# Patient Record
Sex: Male | Born: 1950 | Race: White | Hispanic: No | Marital: Single
Health system: Southern US, Community
[De-identification: ages and names within clinical notes are randomized; demographics above are authoritative.]

---

## 2014-02-01 ENCOUNTER — Other Ambulatory Visit (HOSPITAL_COMMUNITY): Payer: Self-pay

## 2014-02-01 ENCOUNTER — Inpatient Hospital Stay
Admission: AD | Admit: 2014-02-01 | Discharge: 2014-03-05 | Disposition: A | Discharge status: Skilled Nursing Facility | Payer: Self-pay | Source: Ambulatory Visit | Attending: Internal Medicine | Admitting: Internal Medicine

## 2014-02-01 DIAGNOSIS — Z431 Encounter for attention to gastrostomy: Secondary | ICD-10-CM

## 2014-02-01 DIAGNOSIS — E119 Type 2 diabetes mellitus without complications: Secondary | ICD-10-CM

## 2014-02-01 DIAGNOSIS — Z930 Tracheostomy status: Secondary | ICD-10-CM

## 2014-02-01 DIAGNOSIS — K7030 Alcoholic cirrhosis of liver without ascites: Secondary | ICD-10-CM

## 2014-02-01 DIAGNOSIS — A419 Sepsis, unspecified organism: Secondary | ICD-10-CM

## 2014-02-01 DIAGNOSIS — J95821 Acute postprocedural respiratory failure: Secondary | ICD-10-CM

## 2014-02-01 DIAGNOSIS — W3400XA Accidental discharge from unspecified firearms or gun, initial encounter: Secondary | ICD-10-CM

## 2014-02-01 DIAGNOSIS — I615 Nontraumatic intracerebral hemorrhage, intraventricular: Secondary | ICD-10-CM

## 2014-02-01 DIAGNOSIS — K7460 Unspecified cirrhosis of liver: Secondary | ICD-10-CM

## 2014-02-01 DIAGNOSIS — I639 Cerebral infarction, unspecified: Secondary | ICD-10-CM

## 2014-02-01 LAB — BLOOD GAS, ARTERIAL
Acid-base deficit: 1.6 mmol/L (ref 0.0–2.0)
Bicarbonate: 20.6 mEq/L (ref 20.0–24.0)
FIO2: 0.3 %
MECHVT: 550 mL
O2 Saturation: 99.3 %
PEEP/CPAP: 5 cmH2O
PH ART: 7.545 — AB (ref 7.350–7.450)
PO2 ART: 139 mmHg — AB (ref 80.0–100.0)
Patient temperature: 98.6
RATE: 10 resp/min
TCO2: 21.4 mmol/L (ref 0–100)
pCO2 arterial: 23.9 mmHg — ABNORMAL LOW (ref 35.0–45.0)

## 2014-02-02 ENCOUNTER — Other Ambulatory Visit (HOSPITAL_COMMUNITY): Payer: Self-pay

## 2014-02-02 DIAGNOSIS — J95821 Acute postprocedural respiratory failure: Secondary | ICD-10-CM

## 2014-02-02 DIAGNOSIS — I615 Nontraumatic intracerebral hemorrhage, intraventricular: Secondary | ICD-10-CM

## 2014-02-02 DIAGNOSIS — Z431 Encounter for attention to gastrostomy: Secondary | ICD-10-CM

## 2014-02-02 DIAGNOSIS — E119 Type 2 diabetes mellitus without complications: Secondary | ICD-10-CM

## 2014-02-02 DIAGNOSIS — I639 Cerebral infarction, unspecified: Secondary | ICD-10-CM

## 2014-02-02 DIAGNOSIS — K7460 Unspecified cirrhosis of liver: Secondary | ICD-10-CM

## 2014-02-02 DIAGNOSIS — W3400XA Accidental discharge from unspecified firearms or gun, initial encounter: Secondary | ICD-10-CM

## 2014-02-02 DIAGNOSIS — Z930 Tracheostomy status: Secondary | ICD-10-CM

## 2014-02-02 DIAGNOSIS — A419 Sepsis, unspecified organism: Secondary | ICD-10-CM

## 2014-02-02 LAB — CBC WITH DIFFERENTIAL/PLATELET
Basophils Absolute: 0 10*3/uL (ref 0.0–0.1)
Basophils Relative: 0 % (ref 0–1)
Eosinophils Absolute: 0.3 10*3/uL (ref 0.0–0.7)
Eosinophils Relative: 3 % (ref 0–5)
HEMATOCRIT: 31.1 % — AB (ref 39.0–52.0)
HEMOGLOBIN: 10.2 g/dL — AB (ref 13.0–17.0)
LYMPHS ABS: 1.2 10*3/uL (ref 0.7–4.0)
LYMPHS PCT: 10 % — AB (ref 12–46)
MCH: 27.3 pg (ref 26.0–34.0)
MCHC: 32.8 g/dL (ref 30.0–36.0)
MCV: 83.4 fL (ref 78.0–100.0)
MONO ABS: 1.2 10*3/uL — AB (ref 0.1–1.0)
MONOS PCT: 10 % (ref 3–12)
NEUTROS ABS: 9.1 10*3/uL — AB (ref 1.7–7.7)
NEUTROS PCT: 77 % (ref 43–77)
Platelets: 155 10*3/uL (ref 150–400)
RBC: 3.73 MIL/uL — AB (ref 4.22–5.81)
RDW: 14.4 % (ref 11.5–15.5)
WBC: 11.8 10*3/uL — AB (ref 4.0–10.5)

## 2014-02-02 LAB — URINALYSIS, ROUTINE W REFLEX MICROSCOPIC
BILIRUBIN URINE: NEGATIVE
Bilirubin Urine: NEGATIVE
Glucose, UA: NEGATIVE mg/dL
Glucose, UA: NEGATIVE mg/dL
Hgb urine dipstick: NEGATIVE
Ketones, ur: NEGATIVE mg/dL
Ketones, ur: NEGATIVE mg/dL
Leukocytes, UA: NEGATIVE
Leukocytes, UA: NEGATIVE
Nitrite: NEGATIVE
Nitrite: NEGATIVE
PH: 5.5 (ref 5.0–8.0)
PROTEIN: 100 mg/dL — AB
PROTEIN: 100 mg/dL — AB
Specific Gravity, Urine: 1.015 (ref 1.005–1.030)
Specific Gravity, Urine: 1.013 (ref 1.005–1.030)
Urobilinogen, UA: 1 mg/dL (ref 0.0–1.0)
Urobilinogen, UA: 4 mg/dL — ABNORMAL HIGH (ref 0.0–1.0)
pH: 7 (ref 5.0–8.0)

## 2014-02-02 LAB — PREALBUMIN: Prealbumin: 8.9 mg/dL — ABNORMAL LOW (ref 17.0–34.0)

## 2014-02-02 LAB — COMPREHENSIVE METABOLIC PANEL
ALT: 93 U/L — ABNORMAL HIGH (ref 0–53)
ANION GAP: 17 — AB (ref 5–15)
AST: 95 U/L — ABNORMAL HIGH (ref 0–37)
Albumin: 2.4 g/dL — ABNORMAL LOW (ref 3.5–5.2)
Alkaline Phosphatase: 91 U/L (ref 39–117)
BILIRUBIN TOTAL: 0.5 mg/dL (ref 0.3–1.2)
BUN: 12 mg/dL (ref 6–23)
CHLORIDE: 99 meq/L (ref 96–112)
CO2: 18 meq/L — AB (ref 19–32)
CREATININE: 0.63 mg/dL (ref 0.50–1.35)
Calcium: 8.7 mg/dL (ref 8.4–10.5)
GFR calc Af Amer: >90 mL/min (ref >90)
GLUCOSE: 138 mg/dL — AB (ref 70–99)
Potassium: 3.8 mEq/L (ref 3.7–5.3)
Sodium: 134 mEq/L — ABNORMAL LOW (ref 137–147)
Total Protein: 6.9 g/dL (ref 6.0–8.3)

## 2014-02-02 LAB — BLOOD GAS, ARTERIAL
Acid-base deficit: 0.4 mmol/L (ref 0.0–2.0)
Bicarbonate: 22.5 mEq/L (ref 20.0–24.0)
FIO2: 0.3 %
O2 Saturation: 99.5 %
PATIENT TEMPERATURE: 99.4
PEEP/CPAP: 5 cmH2O
PH ART: 7.492 — AB (ref 7.350–7.450)
RATE: 10 resp/min
TCO2: 23.4 mmol/L (ref 0–100)
VT: 450 mL
pCO2 arterial: 29.8 mmHg — ABNORMAL LOW (ref 35.0–45.0)
pO2, Arterial: 213 mmHg — ABNORMAL HIGH (ref 80.0–100.0)

## 2014-02-02 LAB — APTT: aPTT: 27 seconds (ref 24–37)

## 2014-02-02 LAB — PROCALCITONIN: Procalcitonin: 0.17 ng/mL

## 2014-02-02 LAB — PHOSPHORUS: Phosphorus: 4.4 mg/dL (ref 2.3–4.6)

## 2014-02-02 LAB — PROTIME-INR
INR: 1.12 (ref 0.00–1.49)
PROTHROMBIN TIME: 14.4 s (ref 11.6–15.2)

## 2014-02-02 LAB — EXPECTORATED SPUTUM ASSESSMENT W GRAM STAIN, RFLX TO RESP C

## 2014-02-02 LAB — URINE MICROSCOPIC-ADD ON

## 2014-02-02 LAB — T4, FREE: FREE T4: 1.34 ng/dL (ref 0.80–1.80)

## 2014-02-02 LAB — VITAMIN B12: Vitamin B-12: 545 pg/mL (ref 211–911)

## 2014-02-02 LAB — MAGNESIUM: Magnesium: 1.9 mg/dL (ref 1.5–2.5)

## 2014-02-02 LAB — FOLATE RBC: RBC Folate: 1075 ng/mL — ABNORMAL HIGH (ref >280)

## 2014-02-02 LAB — TSH: TSH: 2.89 u[IU]/mL (ref 0.350–4.500)
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
## 2014-02-03 DIAGNOSIS — K7030 Alcoholic cirrhosis of liver without ascites: Secondary | ICD-10-CM

## 2014-02-03 DIAGNOSIS — I6350 Cerebral infarction due to unspecified occlusion or stenosis of unspecified cerebral artery: Secondary | ICD-10-CM

## 2014-02-03 LAB — BLOOD GAS, ARTERIAL
Acid-Base Excess: 0.6 mmol/L (ref 0.0–2.0)
Bicarbonate: 23.8 mEq/L (ref 20.0–24.0)
FIO2: 0.3 %
LHR: 10 {breaths}/min
O2 Saturation: 100 %
PATIENT TEMPERATURE: 98.6
PCO2 ART: 32.9 mmHg — AB (ref 35.0–45.0)
PEEP/CPAP: 5 cmH2O
PH ART: 7.473 — AB (ref 7.350–7.450)
TCO2: 24.8 mmol/L (ref 0–100)
VT: 450 mL
pO2, Arterial: 356 mmHg — ABNORMAL HIGH (ref 80.0–100.0)

## 2014-02-03 LAB — PROCALCITONIN: Procalcitonin: <0.1 ng/mL

## 2014-02-03 LAB — URINE CULTURE
CULTURE: NO GROWTH
Colony Count: NO GROWTH

## 2014-02-03 LAB — AMMONIA: AMMONIA: 28 umol/L (ref 11–60)

## 2014-02-03 LAB — VANCOMYCIN, TROUGH: VANCOMYCIN TR: 16.6 ug/mL (ref 10.0–20.0)
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
## 2014-02-04 LAB — CULTURE, RESPIRATORY W GRAM STAIN

## 2014-02-04 LAB — PROCALCITONIN

## 2014-02-04 LAB — CULTURE, RESPIRATORY
Culture: NORMAL
Gram Stain: NONE SEEN

## 2014-02-07 LAB — CBC
HCT: 31.7 % — ABNORMAL LOW (ref 39.0–52.0)
Hemoglobin: 10.2 g/dL — ABNORMAL LOW (ref 13.0–17.0)
MCH: 27.2 pg (ref 26.0–34.0)
MCHC: 32.2 g/dL (ref 30.0–36.0)
MCV: 84.5 fL (ref 78.0–100.0)
PLATELETS: 160 10*3/uL (ref 150–400)
RBC: 3.75 MIL/uL — AB (ref 4.22–5.81)
RDW: 14.1 % (ref 11.5–15.5)
WBC: 7.6 10*3/uL (ref 4.0–10.5)

## 2014-02-07 LAB — PREALBUMIN: PREALBUMIN: 15.4 mg/dL — AB (ref 17.0–34.0)
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
## 2014-02-08 LAB — CULTURE, BLOOD (ROUTINE X 2)
CULTURE: NO GROWTH
Culture: NO GROWTH

## 2014-02-08 LAB — BASIC METABOLIC PANEL
ANION GAP: 11 (ref 5–15)
BUN: 25 mg/dL — ABNORMAL HIGH (ref 6–23)
CO2: 32 meq/L (ref 19–32)
Calcium: 9.9 mg/dL (ref 8.4–10.5)
Chloride: 96 mEq/L (ref 96–112)
Creatinine, Ser: 0.54 mg/dL (ref 0.50–1.35)
GFR calc Af Amer: >90 mL/min (ref >90)
GLUCOSE: 169 mg/dL — AB (ref 70–99)
POTASSIUM: 4 meq/L (ref 3.7–5.3)
Sodium: 139 mEq/L (ref 137–147)
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
## 2014-02-11 LAB — CBC
HCT: 33 % — ABNORMAL LOW (ref 39.0–52.0)
Hemoglobin: 10.4 g/dL — ABNORMAL LOW (ref 13.0–17.0)
MCH: 27.5 pg (ref 26.0–34.0)
MCHC: 31.5 g/dL (ref 30.0–36.0)
MCV: 87.3 fL (ref 78.0–100.0)
Platelets: 126 10*3/uL — ABNORMAL LOW (ref 150–400)
RBC: 3.78 MIL/uL — AB (ref 4.22–5.81)
RDW: 14.4 % (ref 11.5–15.5)
WBC: 7.4 10*3/uL (ref 4.0–10.5)

## 2014-02-11 LAB — BASIC METABOLIC PANEL
Anion gap: 6 (ref 5–15)
BUN: 35 mg/dL — ABNORMAL HIGH (ref 6–23)
CHLORIDE: 99 meq/L (ref 96–112)
CO2: 34 meq/L — AB (ref 19–32)
CREATININE: 0.64 mg/dL (ref 0.50–1.35)
Calcium: 10 mg/dL (ref 8.4–10.5)
GFR calc non Af Amer: >90 mL/min (ref >90)
Glucose, Bld: 217 mg/dL — ABNORMAL HIGH (ref 70–99)
Potassium: 4.1 mEq/L (ref 3.7–5.3)
Sodium: 139 mEq/L (ref 137–147)

## 2014-02-11 LAB — BLOOD GAS, ARTERIAL
Acid-Base Excess: 8.3 mmol/L — ABNORMAL HIGH (ref 0.0–2.0)
Bicarbonate: 32.5 mEq/L — ABNORMAL HIGH (ref 20.0–24.0)
FIO2: 28 %
O2 Saturation: 98.7 %
PATIENT TEMPERATURE: 98.6
PCO2 ART: 47.5 mmHg — AB (ref 35.0–45.0)
PH ART: 7.451 — AB (ref 7.350–7.450)
TCO2: 34 mmol/L (ref 0–100)
pO2, Arterial: 121 mmHg — ABNORMAL HIGH (ref 80.0–100.0)
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
## 2014-02-17 LAB — BASIC METABOLIC PANEL
Anion gap: 10 (ref 5–15)
BUN: 44 mg/dL — AB (ref 6–23)
CALCIUM: 10.5 mg/dL (ref 8.4–10.5)
CO2: 33 mEq/L — ABNORMAL HIGH (ref 19–32)
Chloride: 100 mEq/L (ref 96–112)
Creatinine, Ser: 0.73 mg/dL (ref 0.50–1.35)
GFR calc Af Amer: >90 mL/min (ref >90)
GFR calc non Af Amer: >90 mL/min (ref >90)
GLUCOSE: 226 mg/dL — AB (ref 70–99)
Potassium: 4.4 mEq/L (ref 3.7–5.3)
SODIUM: 143 meq/L (ref 137–147)

## 2014-02-17 LAB — PREALBUMIN: Prealbumin: 23.1 mg/dL (ref 17.0–34.0)
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
## 2014-02-19 LAB — BASIC METABOLIC PANEL
Anion gap: 10 (ref 5–15)
BUN: 46 mg/dL — ABNORMAL HIGH (ref 6–23)
CALCIUM: 10.3 mg/dL (ref 8.4–10.5)
CO2: 30 meq/L (ref 19–32)
Chloride: 99 mEq/L (ref 96–112)
Creatinine, Ser: 0.77 mg/dL (ref 0.50–1.35)
GFR calc Af Amer: >90 mL/min (ref >90)
GLUCOSE: 188 mg/dL — AB (ref 70–99)
Potassium: 4.3 mEq/L (ref 3.7–5.3)
SODIUM: 139 meq/L (ref 137–147)
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
## 2014-02-21 ENCOUNTER — Other Ambulatory Visit (HOSPITAL_COMMUNITY): Payer: Self-pay

## 2014-02-21 LAB — CBC
HEMATOCRIT: 34 % — AB (ref 39.0–52.0)
Hemoglobin: 11 g/dL — ABNORMAL LOW (ref 13.0–17.0)
MCH: 27.5 pg (ref 26.0–34.0)
MCHC: 32.4 g/dL (ref 30.0–36.0)
MCV: 85 fL (ref 78.0–100.0)
Platelets: 91 10*3/uL — ABNORMAL LOW (ref 150–400)
RBC: 4 MIL/uL — ABNORMAL LOW (ref 4.22–5.81)
RDW: 15.1 % (ref 11.5–15.5)
WBC: 9.3 10*3/uL (ref 4.0–10.5)

## 2014-02-21 LAB — BASIC METABOLIC PANEL
Anion gap: 10 (ref 5–15)
BUN: 43 mg/dL — AB (ref 6–23)
CALCIUM: 10.2 mg/dL (ref 8.4–10.5)
CHLORIDE: 99 meq/L (ref 96–112)
CO2: 30 mEq/L (ref 19–32)
CREATININE: 0.64 mg/dL (ref 0.50–1.35)
GFR calc non Af Amer: >90 mL/min (ref >90)
Glucose, Bld: 106 mg/dL — ABNORMAL HIGH (ref 70–99)
Potassium: 4.4 mEq/L (ref 3.7–5.3)
Sodium: 139 mEq/L (ref 137–147)
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
## 2014-02-28 LAB — CULTURE, RESPIRATORY

## 2014-02-28 LAB — CULTURE, RESPIRATORY W GRAM STAIN
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
## 2014-03-02 NOTE — Progress Notes (Signed)
[facility]                                                                                              Progress note     Patient Demographics  Dominic Cross, is a 63 y.o. male  [id number]  [id number]  DOB - January 18, 1951  Admit date - 02/01/2014  Admitting Physician Carron Curie, MD  Outpatient Primary MD for the patient is No primary provider on file.  LOS - 29   Chief complaint   Respiratory failure   Intracerebral bleed     Subjective:   Dominic Cross cannot give any history or ROS 2/2 encephalopathy.   Objective:   Vital signs  Tmax:   98.6 Heart rate:  60-70 Respiratory rate: 18-20 Blood pressure: 120/70-150/80 Pulse Ox:  97-99%  I/O:   Reviewed, adequate UOP BM:   2  Exam  Gen:  Awake but somolent  HEENT: [place]/AT, PERRLA EOMI bilaterally, MMM NECK: Supple, No JVD or LAD. Tracheostomy in midline #6 LUNGS:Symmetrical Chest wall movement, decreased breath sounds bilaterally with fine rales CV; Normal S1, S2, with grade II/VI systolic murmur, no c/r. No parasternal heave ABD: S/NT/ND, BS X 4. PEG tube in place and secure EXT: Diffuse muscle wasting, No cyanosis, clubbing or edema, Pulses 2+    Data Review   CBC No results found for this basename: WBC, HGB, HCT, PLT, MCV, MCH, MCHC, RDW, NEUTRABS, LYMPHSABS, MONOABS, EOSABS, BASOSABS, BANDABS, BANDSABD,  in the last 168 hours  Chemistries  No results found for this basename: NA, K, CL, CO2, GLUCOSE, BUN, CREATININE, GFRCGP, CALCIUM, MG, AST, ALT, ALKPHOS, BILITOT,  in the last 168 hours ------------------------------------------------------------------------------------------------------------------ CrCl is unknown because there is no height on file for the current visit. ------------------------------------------------------------------------------------------------------------------ No results found for this basename: HGBA1C,   in the last 72 hours ------------------------------------------------------------------------------------------------------------------ No results found for this basename: CHOL, HDL, LDLCALC, TRIG, CHOLHDL, LDLDIRECT,  in the last 72 hours ------------------------------------------------------------------------------------------------------------------ No results found for this basename: TSH, T4TOTAL, FREET3, T3FREE, THYROIDAB,  in the last 72 hours ------------------------------------------------------------------------------------------------------------------ No results found for this basename: VITAMINB12, FOLATE, FERRITIN, TIBC, IRON, RETICCTPCT,  in the last 72 hours  Coagulation profile No results found for this basename: INR, PROTIME,  in the last 168 hours  No results found for this basename: DDIMER,  in the last 72 hours  Cardiac Enzymes No results found for this basename: CK, CKMB, TROPONINI, MYOGLOBIN,  in the last 168 hours ------------------------------------------------------------------------------------------------------------------ No components found with this basename: POCBNP,   Micro Results Recent Results (from the past 240 hour(s))  CULTURE, RESPIRATORY (NON-EXPECTORATED)     Status: None   Collection Time    02/25/14  5:19 PM      Result Value Ref Range Status   Specimen Description TRACHEAL ASPIRATE   Final   Special Requests NONE   Final   Gram Stain     Final   Value: FEW WBC PRESENT,BOTH PMN AND MONONUCLEAR     FEW SQUAMOUS EPITHELIAL CELLS PRESENT     FEW GRAM NEGATIVE RODS     Performed at [facility]  Final   Value: ABUNDANT PSEUDOMONAS AERUGINOSA     Performed at [facility]   Report Status 02/28/2014 FINAL   Final   Organism ID, Bacteria PSEUDOMONAS AERUGINOSA   Final       Assessment & Plan    VDRF/Pseudomonas aeruginosa tracheobronchitis: -continue with ATC 28% -Secretions improved since starting  doxycycline, complete 5 day course, complete 9/11; otherwise completed IV abx  Intracranial hemorrhage/Acute Encephalopathy/Alcoholism: -maintain a normotensive state, acute encephalopathy worsen with h/o long time alcoholism and ICH -likely near baseline with some irreversible damage from alcoholism and previous brain hemorrhage -can titrate seroquel  BID if needed for acute psychosis, continue to monitor  Diabetes mellitus type 2, uncontrolled: -continue to titrate insulin to goals and tolerance (levemir 16units BID)  Protein calorie malnutrition on tube feeds per PEG tube/Generalized Weakness: -continue nutritional support, vitamin supplementation; continue PT/OT efforts, limited 2/2 organic brain disease and encephalopathy  Liver cirrhosis -monitor LFTS, CBC, Coags profile and for active bleeding  Urinary retention: -resolved, foley has been d/c and adequate UOP   Hypertension, uncontrolled: -improved, continue to titrate anti-hypertensive agents to goal and tolerance.  -norvasc  and metoprolol  BID  Bradycardia: -resolved, continue to monitor VS trends  -monitor given scheduled dose of metoprolol Acute Constipation: -initiating Senna-S 2 tabs PO qhs especially given scheduled oxycodone  Code Status: Full  DVT Prophylaxis SCDs GI Proph: pepcid and probiotics  Berton Lan M.D on 03/02/2014 at 10:07 PM

## 2014-03-03 NOTE — Progress Notes (Addendum)
[facility]                                                                                              Progress note     Patient Demographics  Dominic Cross, is a 63 y.o. male  ZOX:[id number]  [id number]:[id number]  DOB - 11/04/1950  Admit date - 02/01/2014  Admitting Physician Carron Curie, MD  Outpatient Primary MD for the patient is No primary provider on file.  LOS - 30   Chief complaint   Respiratory failure   Intracerebral bleed       Subjective:   Dominic Cross cannot give any history 2/2 encephalopathy. Decreased secretions.  Objective:   Vital signs  Tmax: 98.4  Heart rate: 60-70  Respiratory rate: 18-20  Blood pressure: 120/70-144/760 Pulse Ox: 97-99%  I/O: Reviewed, adequate UOP  BM: 0  Exam  Gen: Awake but somolent  HEENT: /AT, PERRLA EOMI bilaterally, MMM  NECK: Supple, No JVD or LAD. Tracheostomy in midline #6  LUNGS:Symmetrical Chest wall movement, decreased breath sounds bilaterally with fine rales  CV; Normal S1, S2, with grade II/VI systolic murmur, no c/r. No parasternal heave  ABD: S/NT/ND, BS X 4. PEG tube in place and secure  EXT: Diffuse muscle wasting, No cyanosis, clubbing or edema, Pulses 2+  Data Review   CBC No results found for this basename: WBC, HGB, HCT, PLT, MCV, MCH, MCHC, RDW, NEUTRABS, LYMPHSABS, MONOABS, EOSABS, BASOSABS, BANDABS, BANDSABD,  in the last 168 hours  Chemistries  No results found for this basename: NA, K, CL, CO2, GLUCOSE, BUN, CREATININE, GFRCGP, CALCIUM, MG, AST, ALT, ALKPHOS, BILITOT,  in the last 168 hours ------------------------------------------------------------------------------------------------------------------ CrCl is unknown because there is no height on file for the current visit. ------------------------------------------------------------------------------------------------------------------ No results found for this  basename: HGBA1C,  in the last 72 hours ------------------------------------------------------------------------------------------------------------------ No results found for this basename: CHOL, HDL, LDLCALC, TRIG, CHOLHDL, LDLDIRECT,  in the last 72 hours ------------------------------------------------------------------------------------------------------------------ No results found for this basename: TSH, T4TOTAL, FREET3, T3FREE, THYROIDAB,  in the last 72 hours ------------------------------------------------------------------------------------------------------------------ No results found for this basename: VITAMINB12, FOLATE, FERRITIN, TIBC, IRON, RETICCTPCT,  in the last 72 hours  Coagulation profile No results found for this basename: INR, PROTIME,  in the last 168 hours  No results found for this basename: DDIMER,  in the last 72 hours  Cardiac Enzymes No results found for this basename: CK, CKMB, TROPONINI, MYOGLOBIN,  in the last 168 hours ------------------------------------------------------------------------------------------------------------------ No components found with this basename: POCBNP,   Micro Results Recent Results (from the past 240 hour(s))  CULTURE, RESPIRATORY (NON-EXPECTORATED)     Status: None   Collection Time    02/25/14  5:19 PM      Result Value Ref Range Status   Specimen Description TRACHEAL ASPIRATE   Final   Special Requests NONE   Final   Gram Stain     Final   Value: FEW WBC PRESENT,BOTH PMN AND MONONUCLEAR     FEW SQUAMOUS EPITHELIAL CELLS PRESENT     FEW GRAM NEGATIVE RODS     Performed at [facility]  Final   Value: ABUNDANT PSEUDOMONAS AERUGINOSA     Performed at [facility]   Report Status 02/28/2014 FINAL   Final   Organism ID, Bacteria PSEUDOMONAS AERUGINOSA   Final    Assessment & Plan   VDRF/Pseudomonas aeruginosa tracheobronchitis:  -continue with ATC 28%  -Secretions improved since  starting doxycycline, complete 5 day course, complete 9/11; otherwise completed IV abx   Intracranial hemorrhage/Acute Encephalopathy/Alcoholism:  -maintain a normotensive state, acute encephalopathy worsen with h/o long time alcoholism and ICH  -likely near baseline with some irreversible damage from alcoholism and previous brain hemorrhage  -can titrate seroquel  BID if needed for acute psychosis, continue to monitor   Diabetes mellitus type 2, uncontrolled:  -continue to titrate insulin to goals and tolerance (levemir 16units BID)   Protein calorie malnutrition on tube feeds per PEG tube/Generalized Weakness:  -continue nutritional support, vitamin supplementation; continue PT/OT efforts, limited 2/2 organic brain disease and encephalopathy   Liver cirrhosis  -monitor LFTS, CBC, Coags profile and for active bleeding; redraw 9/12  Hypertension, uncontrolled: normotensive today -improved, continue to titrate anti-hypertensive agents to goal and tolerance.  -norvasc  and metoprolol  BID   Bradycardia:  -resolved, continue to monitor VS trends  -monitor given scheduled dose of metoprolol   Acute Constipation:  -initiating Senna-S 2 tabs PO qhs especially given scheduled oxycodone   Code Status: Full  Family Communication: Family meeting, 35 minutes with 3 family members and case mgmt, all details of treatment plan, prognosis, clinical state and discharge plan discussed. All questions answer. Awaiting SNF placement  DVT Prophylaxis SCDs  GI Proph: pepcid and probiotics  Berton Lan M.D on 03/03/2014 at 11:34 PM

## 2014-03-04 NOTE — Progress Notes (Addendum)
[facility]                                                                                              Progress note     Patient Demographics  Dominic Cross, is a 9Davi Cross  ZOX:[id number]  WJX:[id number]  DOB - 1950/11/10  Admit date - 02/01/2014  Admitting Physician Carron Curie, MD  Outpatient Primary MD for the patient is No primary provider on file.  LOS - 31   Chief complaint   Respiratory failure   Intracerebral bleed      Subjective:   Dominic Cross cannot give any history 2/2 encephalopathy, however alert and activated.    Objective:   Vital signs  Tmax: 98.4  Heart rate: 58-89  Respiratory rate: 18-20  Blood pressure: 120/70-144/760 Pulse Ox: 97-99%  I/O: Reviewed, adequate UOP BG: ? BM: 4  Exam  Gen: Awake and Alert, best this week HEENT: [place]/AT, PERRLA EOMI bilaterally, MMM  NECK: Supple, No JVD or LAD. Tracheostomy in midline #6  LUNGS:Symmetrical Chest wall movement, decreased breath sounds bilaterally with fine rales  CV; Normal S1, S2, with grade II/VI systolic murmur, no c/r. No parasternal heave  ABD: S/NT/ND, BS X 4. PEG tube in place and secure  EXT: Diffuse muscle wasting, No cyanosis, clubbing or edema, Pulses 2+  Data Review   CBC No results found for this basename: WBC, HGB, HCT, PLT, MCV, MCH, MCHC, RDW, NEUTRABS, LYMPHSABS, MONOABS, EOSABS, BASOSABS, BANDABS, BANDSABD,  in the last 168 hours  Chemistries  No results found for this basename: NA, K, CL, CO2, GLUCOSE, BUN, CREATININE, GFRCGP, CALCIUM, MG, AST, ALT, ALKPHOS, BILITOT,  in the last 168 hours ------------------------------------------------------------------------------------------------------------------ CrCl is unknown because there is no height on file for the current visit. ------------------------------------------------------------------------------------------------------------------ No  results found for this basename: HGBA1C,  in the last 72 hours ------------------------------------------------------------------------------------------------------------------ No results found for this basename: CHOL, HDL, LDLCALC, TRIG, CHOLHDL, LDLDIRECT,  in the last 72 hours ------------------------------------------------------------------------------------------------------------------ No results found for this basename: TSH, T4TOTAL, FREET3, T3FREE, THYROIDAB,  in the last 72 hours ------------------------------------------------------------------------------------------------------------------ No results found for this basename: VITAMINB12, FOLATE, FERRITIN, TIBC, IRON, RETICCTPCT,  in the last 72 hours  Coagulation profile No results found for this basename: INR, PROTIME,  in the last 168 hours  No results found for this basename: DDIMER,  in the last 72 hours  Cardiac Enzymes No results found for this basename: CK, CKMB, TROPONINI, MYOGLOBIN,  in the last 168 hours ------------------------------------------------------------------------------------------------------------------ No components found with this basename: POCBNP,   Micro Results Recent Results (from the past 240 hour(s))  CULTURE, RESPIRATORY (NON-EXPECTORATED)     Status: None   Collection Time    02/25/14  5:19 PM      Result Value Ref Range Status   Specimen Description TRACHEAL ASPIRATE   Final   Special Requests NONE   Final   Gram Stain     Final   Value: FEW WBC PRESENT,BOTH PMN AND MONONUCLEAR     FEW SQUAMOUS EPITHELIAL CELLS PRESENT     FEW GRAM NEGATIVE RODS     Performed at [facility]   Culture     Final   Value: ABUNDANT PSEUDOMONAS AERUGINOSA     Performed at [facility]   Report Status 02/28/2014 FINAL   Final   Organism ID, Bacteria PSEUDOMONAS AERUGINOSA   Final    Assessment & Plan   VDRF/Pseudomonas aeruginosa tracheobronchitis:  -continue with ATC 28%   -Secretions improved since starting doxycycline, course completed today  Intracranial hemorrhage/Acute Encephalopathy/Alcoholism:  -maintain a normotensive state, acute encephalopathy worsen with h/o long time alcoholism and ICH  -likely near baseline with some irreversible damage from alcoholism and previous brain hemorrhage  -can titrate seroquel  BID if needed for acute psychosis, continue to monitor   Diabetes mellitus type 2, uncontrolled:  -continue to titrate insulin to goals and tolerance (levemir 16units BID)  -ordered accuchecks q 6h and results to be recorded on pts flowsheet so we can monitor trends  Protein calorie malnutrition on tube feeds per PEG tube/Generalized Weakness:  -continue nutritional support, vitamin supplementation; continue PT/OT efforts, limited 2/2 organic brain disease and encephalopathy  -add thiamine and folate daily -check mg, and phos in am  Liver cirrhosis  -monitor LFTS, CBC, Coags profile and for active bleeding; redraw 9/12 -monitor for statin tolerance given HLP and taking Lipitor  daily  HLP: -see above  Hypertension, uncontrolled: normotensive today -improved, continue to titrate anti-hypertensive agents to goal and tolerance.  -norvasc  and metoprolol  BID   Bradycardia:  -resolved, continue to monitor VS trends  -monitor given scheduled dose of metoprolol   Acute Constipation: improved -continue Senna-S 2 tabs PO qhs especially given scheduled oxycodone; hold if increase stooling  Code Status: Full   DVT Prophylaxis SCDs  GI Proph: pepcid and probiotics  Berton Lan M.D on 03/04/2014 at 1:05 PM

## 2014-03-05 LAB — CBC
HEMATOCRIT: 36 % — AB (ref 39.0–52.0)
HEMOGLOBIN: 11.6 g/dL — AB (ref 13.0–17.0)
MCH: 27.4 pg (ref 26.0–34.0)
MCHC: 32.2 g/dL (ref 30.0–36.0)
MCV: 84.9 fL (ref 78.0–100.0)
Platelets: 97 10*3/uL — ABNORMAL LOW (ref 150–400)
RBC: 4.24 MIL/uL (ref 4.22–5.81)
RDW: 15 % (ref 11.5–15.5)
WBC: 9.3 10*3/uL (ref 4.0–10.5)

## 2014-03-05 LAB — COMPREHENSIVE METABOLIC PANEL
ALT: 77 U/L — AB (ref 0–53)
AST: 58 U/L — AB (ref 0–37)
Albumin: 3.6 g/dL (ref 3.5–5.2)
Alkaline Phosphatase: 100 U/L (ref 39–117)
Anion gap: 10 (ref 5–15)
BUN: 41 mg/dL — ABNORMAL HIGH (ref 6–23)
CO2: 30 mEq/L (ref 19–32)
Calcium: 10.8 mg/dL — ABNORMAL HIGH (ref 8.4–10.5)
Chloride: 98 mEq/L (ref 96–112)
Creatinine, Ser: 0.62 mg/dL (ref 0.50–1.35)
GFR calc Af Amer: >90 mL/min (ref >90)
GFR calc non Af Amer: >90 mL/min (ref >90)
Glucose, Bld: 142 mg/dL — ABNORMAL HIGH (ref 70–99)
POTASSIUM: 4.6 meq/L (ref 3.7–5.3)
SODIUM: 138 meq/L (ref 137–147)
TOTAL PROTEIN: 9.1 g/dL — AB (ref 6.0–8.3)
Total Bilirubin: 0.3 mg/dL (ref 0.3–1.2)

## 2014-03-05 LAB — PHOSPHORUS: Phosphorus: 4.4 mg/dL (ref 2.3–4.6)

## 2014-03-05 LAB — PROTIME-INR
INR: 1.05 (ref 0.00–1.49)
PROTHROMBIN TIME: 13.7 s (ref 11.6–15.2)

## 2014-03-05 LAB — APTT: aPTT: 28 seconds (ref 24–37)

## 2014-03-05 LAB — MAGNESIUM: MAGNESIUM: 2 mg/dL (ref 1.5–2.5)
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
## 2015-09-12 IMAGING — CR DG ABD PORTABLE 1V
1 series · 1 of 1 positions shown · non-contrast
Comparison: None.

CLINICAL DATA: Check gastrostomy placement

EXAM:
PORTABLE ABDOMEN - 1 VIEW

[AP]
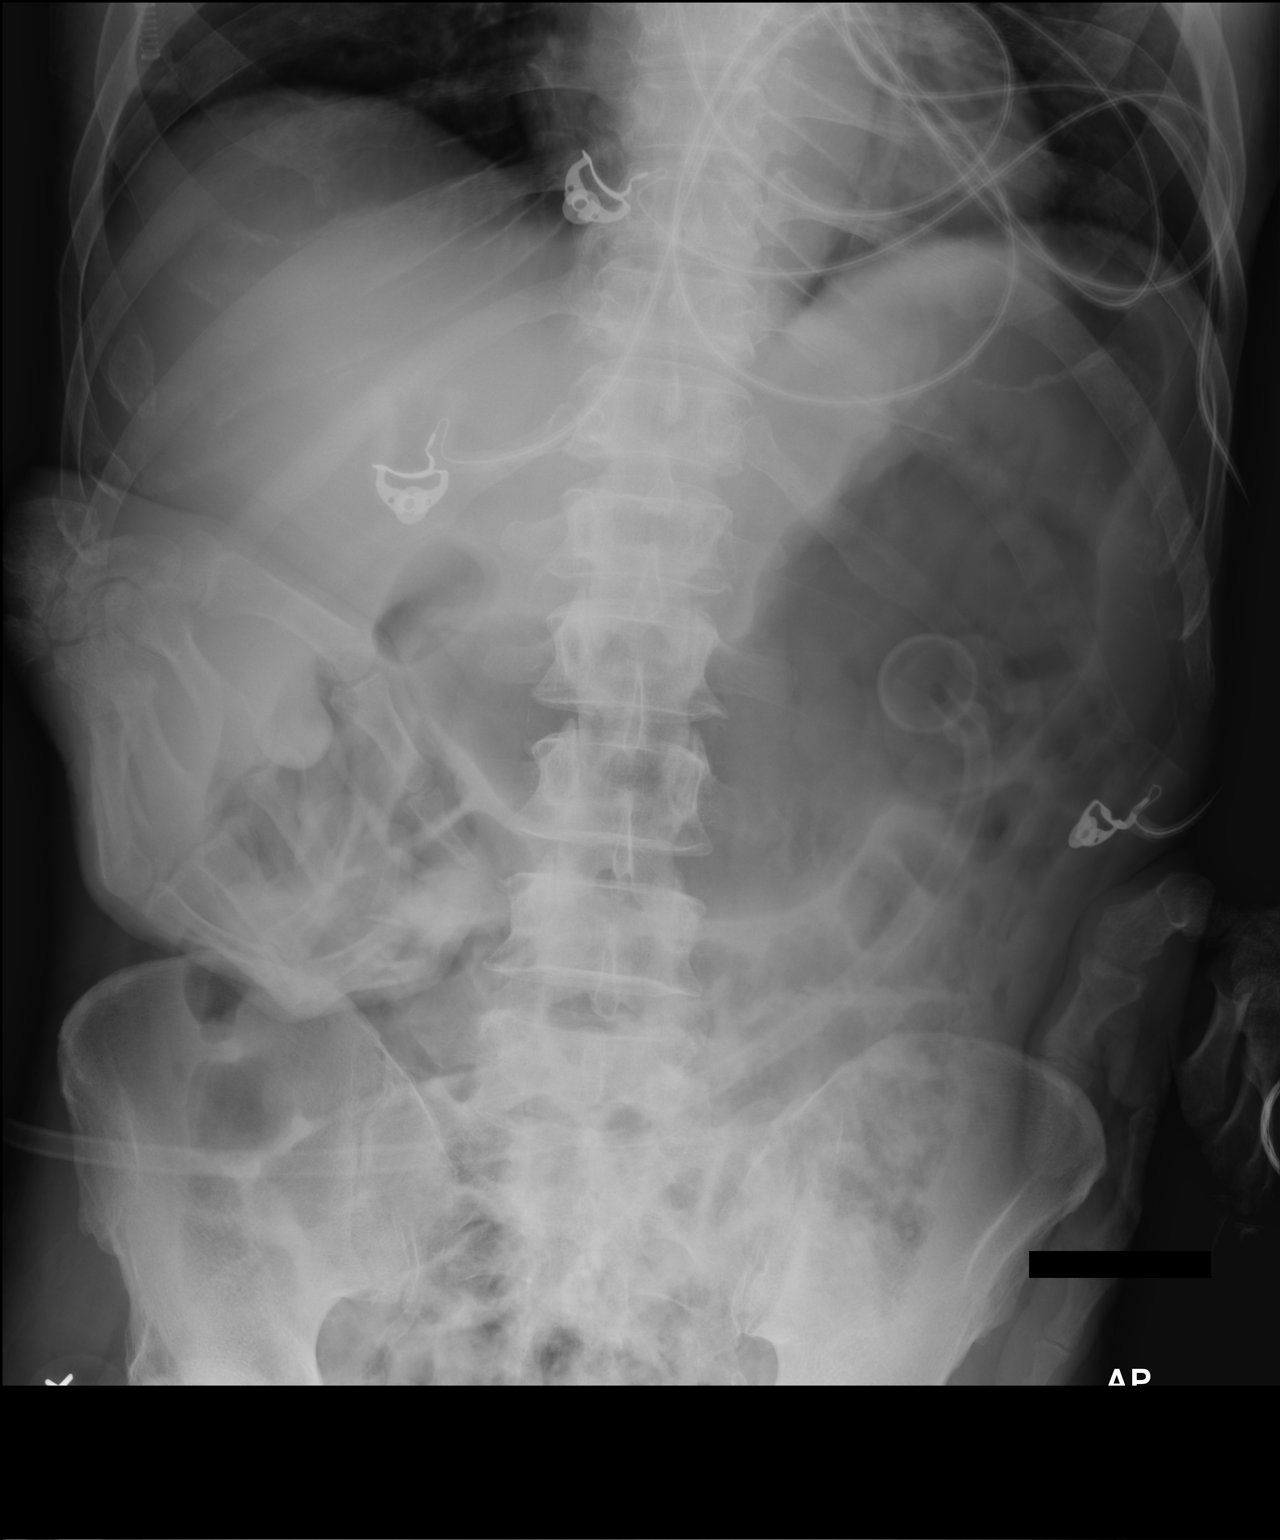

[1 of 1 positions shown; findings below may reference images not displayed]

FINDINGS: Scattered large and small bowel gas is noted. Gastrostomy catheter
appears to lie within the stomach. No acute bony abnormality is
seen.

## 2015-09-13 IMAGING — CR DG CHEST 1V PORT
1 series · 1 of 1 positions shown · non-contrast
Comparison: 1 day prior

CLINICAL DATA: PICC line placement.

EXAM:
PORTABLE CHEST - 1 VIEW

[AP]
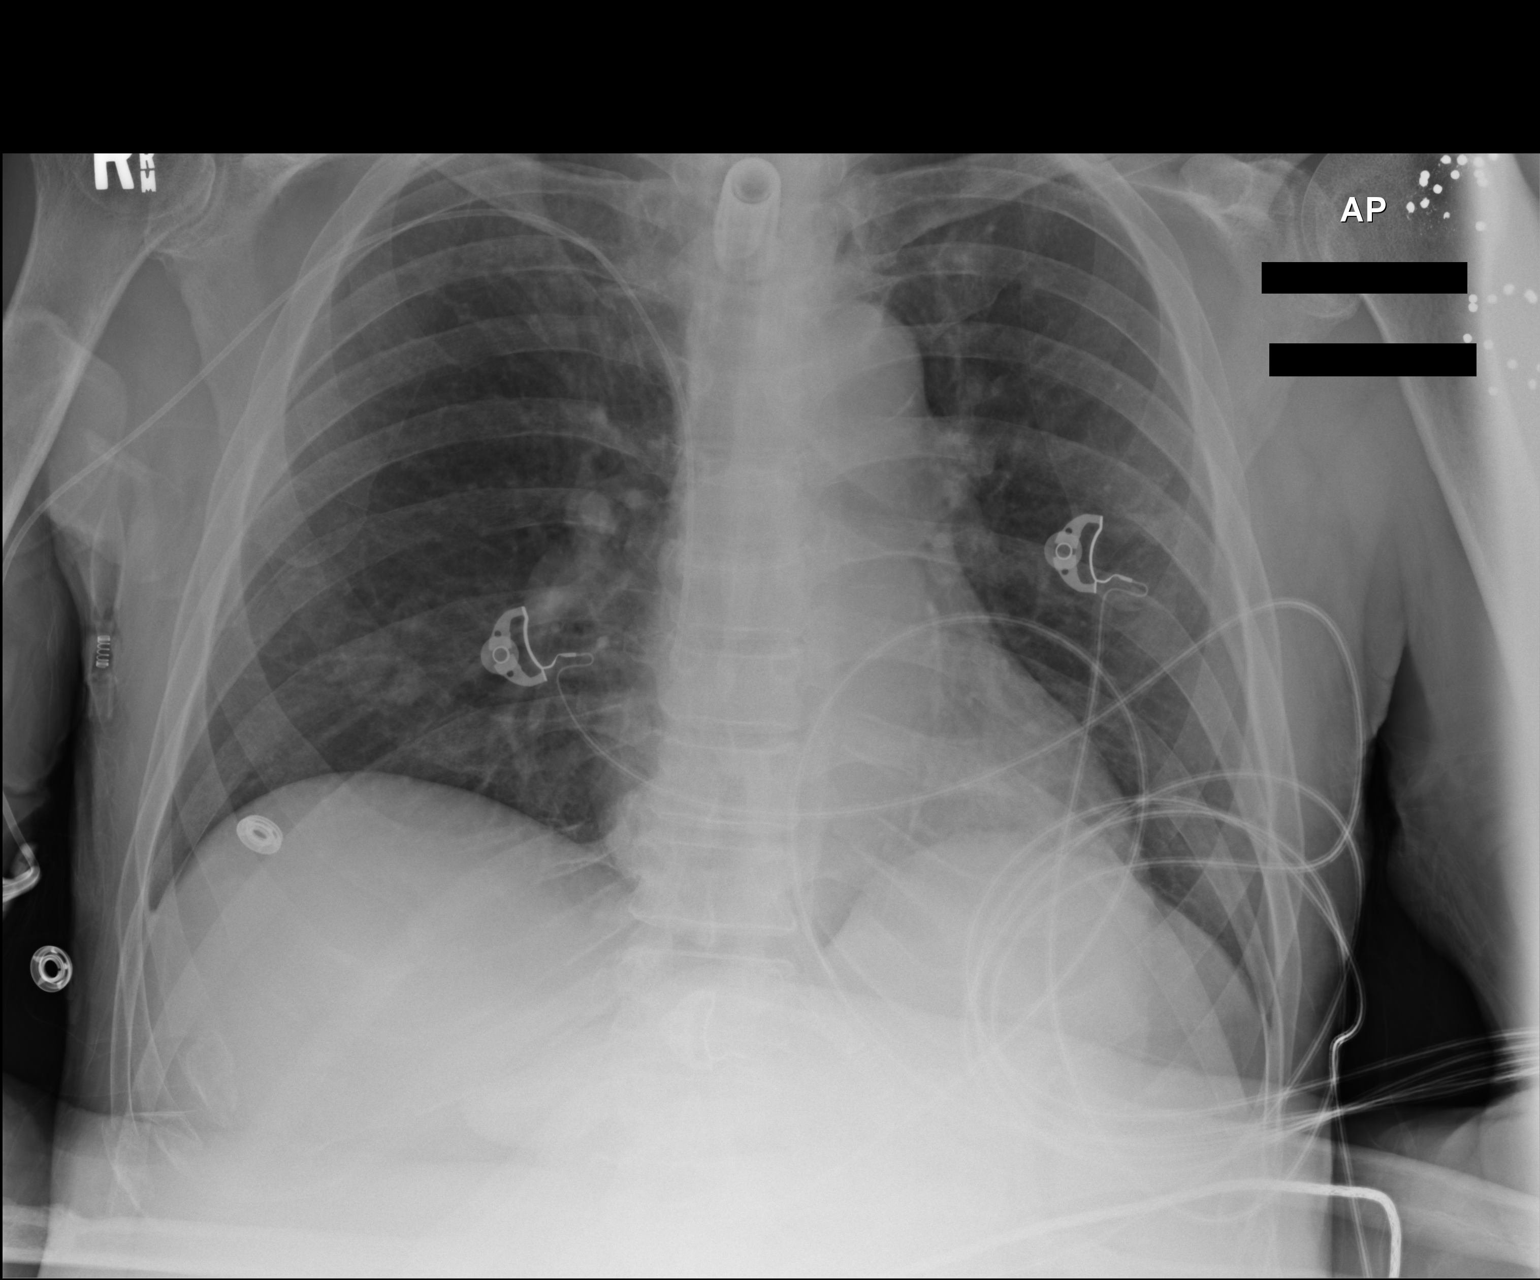

[1 of 1 positions shown; findings below may reference images not displayed]

FINDINGS: A right-sided PICC line terminates at the low SVC versus cavoatrial
junction. Tracheostomy remains appropriately positioned. Midline
trachea. Normal heart size. No pleural effusion or pneumothorax.
Mild left base airspace disease.
IMPRESSION: Right-sided PICC line terminating at low SVC versus cavoatrial
junction. This could be retracted 2-3 cm to be positioned well
within the low SVC. No pneumothorax.

Developing left base Airspace disease, likely atelectasis.

## 2015-10-02 IMAGING — CR DG CHEST 1V PORT
1 series · 1 of 1 positions shown · non-contrast
Comparison: 02/02/2014

CLINICAL DATA: Respiratory failure

EXAM:
PORTABLE CHEST - 1 VIEW

[portable]
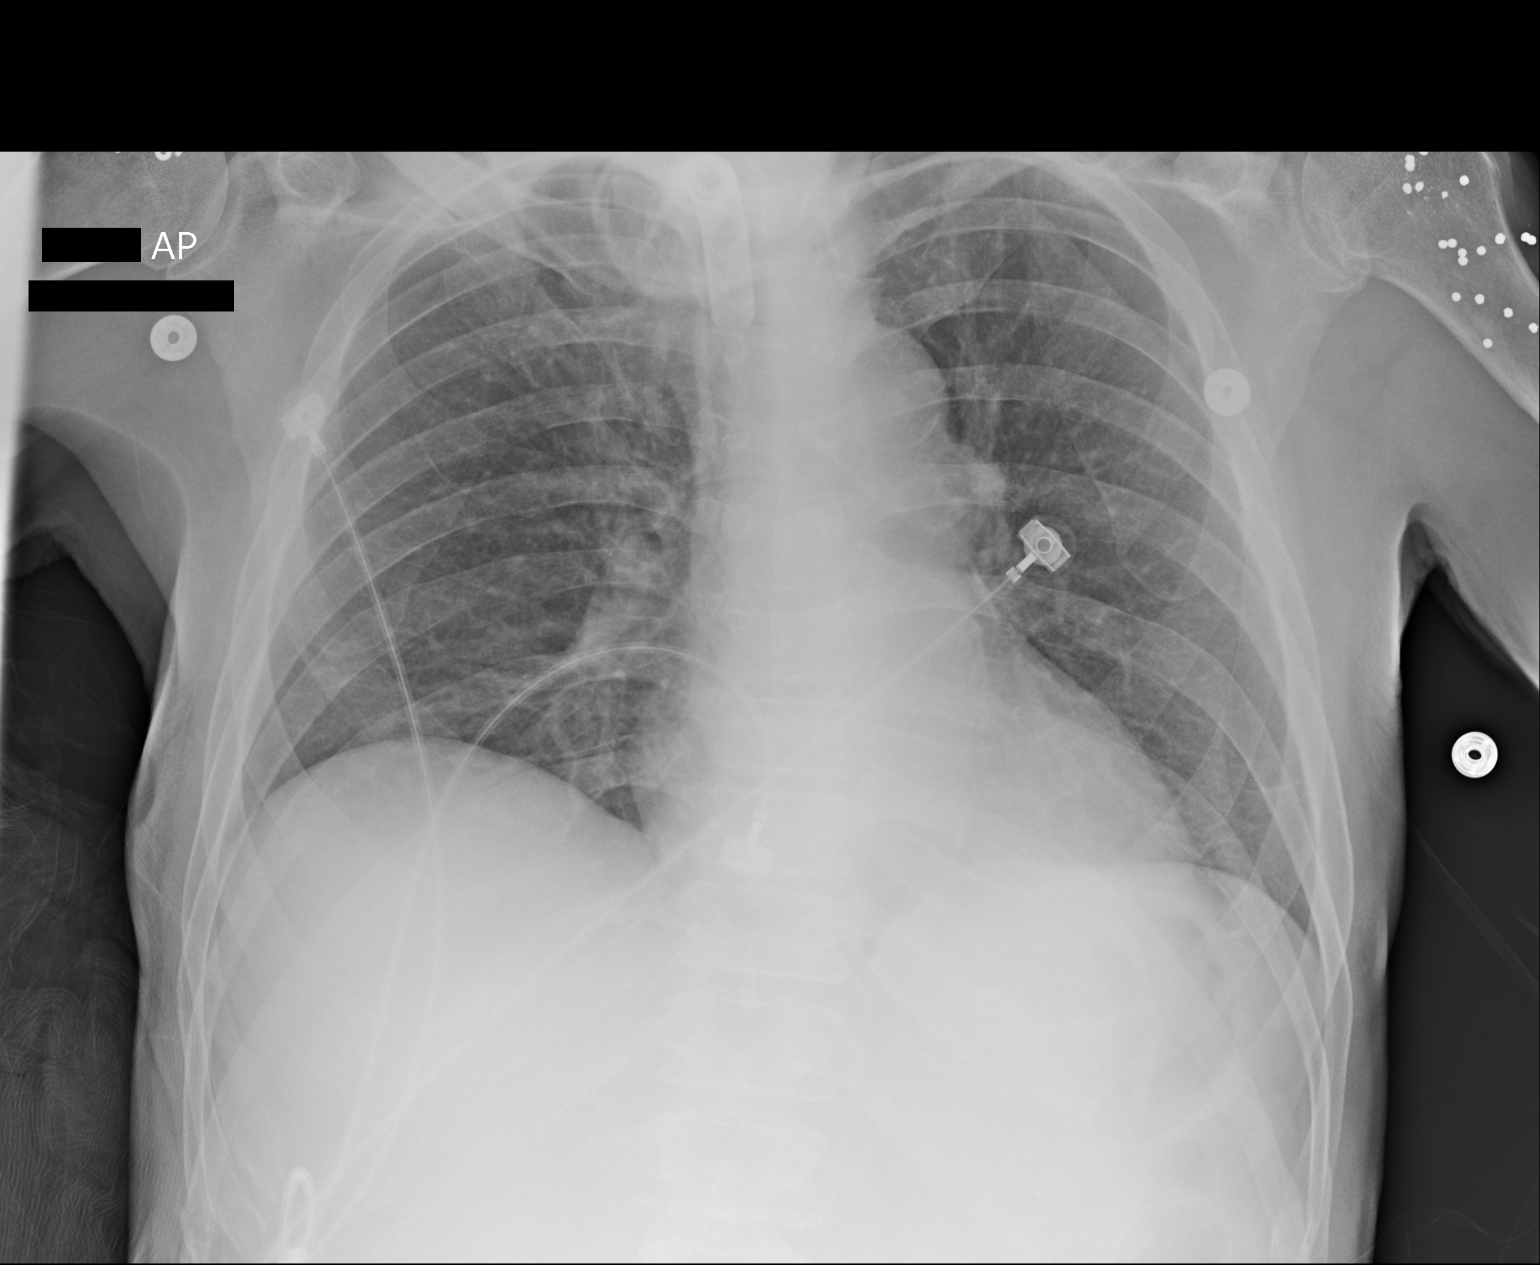

[1 of 1 positions shown; findings below may reference images not displayed]

FINDINGS: Tracheostomy tube remains well seated. Right upper extremity PICC
has been removed.

Normal heart size and mediastinal contours. Lung volumes remain low
but there is no edema or consolidation. No effusion or pneumothorax.
Skin fold the mid over the left apex.
IMPRESSION: Negative for edema or consolidation.

## 2019-04-25 DEATH — deceased
